# Patient Record
Sex: Male | Born: 1985 | Race: White | Hispanic: No | Marital: Single | Smoking: Current every day smoker
Health system: Southern US, Community
[De-identification: ages and names within clinical notes are randomized; demographics above are authoritative.]

## PROBLEM LIST (undated history)

## (undated) HISTORY — PX: OTHER SURGICAL HISTORY: SHX169

---

## 2009-06-07 ENCOUNTER — Emergency Department: Payer: Self-pay | Admitting: Emergency Medicine

## 2010-07-11 ENCOUNTER — Emergency Department: Payer: Self-pay | Admitting: Emergency Medicine

## 2010-07-17 ENCOUNTER — Emergency Department: Payer: Self-pay | Admitting: Emergency Medicine

## 2010-07-22 ENCOUNTER — Emergency Department: Payer: Self-pay | Admitting: Emergency Medicine

## 2013-08-22 ENCOUNTER — Ambulatory Visit: Payer: Self-pay

## 2013-08-22 LAB — COMPREHENSIVE METABOLIC PANEL
ALBUMIN: 4.1 g/dL (ref 3.4–5.0)
ALT: 22 U/L
Alkaline Phosphatase: 120 U/L — ABNORMAL HIGH
Anion Gap: 6 — ABNORMAL LOW (ref 7–16)
BUN: 10 mg/dL (ref 7–18)
Bilirubin,Total: 0.4 mg/dL (ref 0.2–1.0)
CALCIUM: 9.4 mg/dL (ref 8.5–10.1)
Chloride: 100 mmol/L (ref 98–107)
Co2: 30 mmol/L (ref 21–32)
Creatinine: 1.05 mg/dL (ref 0.60–1.30)
EGFR (African American): >60
GLUCOSE: 93 mg/dL (ref 65–99)
Osmolality: 271 (ref 275–301)
POTASSIUM: 4.3 mmol/L (ref 3.5–5.1)
SGOT(AST): 20 U/L (ref 15–37)
Sodium: 136 mmol/L (ref 136–145)
Total Protein: 7.8 g/dL (ref 6.4–8.2)

## 2013-08-22 LAB — CBC WITH DIFFERENTIAL/PLATELET
Basophil #: 0.1 10*3/uL (ref 0.0–0.1)
Basophil %: 1.8 %
EOS ABS: 1.3 10*3/uL — AB (ref 0.0–0.7)
Eosinophil %: 18.2 %
HCT: 49 % (ref 40.0–52.0)
HGB: 16.3 g/dL (ref 13.0–18.0)
LYMPHS PCT: 34.3 %
Lymphocyte #: 2.5 10*3/uL (ref 1.0–3.6)
MCH: 31.5 pg (ref 26.0–34.0)
MCHC: 33.2 g/dL (ref 32.0–36.0)
MCV: 95 fL (ref 80–100)
Monocyte #: 0.7 x10 3/mm (ref 0.2–1.0)
Monocyte %: 9.5 %
NEUTROS ABS: 2.7 10*3/uL (ref 1.4–6.5)
Neutrophil %: 36.2 %
PLATELETS: 306 10*3/uL (ref 150–440)
RBC: 5.16 10*6/uL (ref 4.40–5.90)
RDW: 12.6 % (ref 11.5–14.5)
WBC: 7.3 10*3/uL (ref 3.8–10.6)

## 2013-08-22 LAB — LIPASE, BLOOD: LIPASE: 83 U/L (ref 73–393)

## 2013-08-22 LAB — AMYLASE: AMYLASE: 54 U/L (ref 25–115)

## 2013-08-22 LAB — OCCULT BLOOD X 1 CARD TO LAB, STOOL: Occult Blood, Feces: POSITIVE

## 2014-07-16 ENCOUNTER — Emergency Department: Payer: Self-pay

## 2014-07-16 ENCOUNTER — Emergency Department
Admission: EM | Admit: 2014-07-16 | Discharge: 2014-07-16 | Disposition: A | Discharge status: Home / Self Care | Payer: Self-pay | Attending: Emergency Medicine | Admitting: Emergency Medicine

## 2014-07-16 ENCOUNTER — Encounter: Payer: Self-pay | Admitting: Urgent Care

## 2014-07-16 DIAGNOSIS — W25XXXA Contact with sharp glass, initial encounter: Secondary | ICD-10-CM | POA: Insufficient documentation

## 2014-07-16 DIAGNOSIS — Y998 Other external cause status: Secondary | ICD-10-CM | POA: Insufficient documentation

## 2014-07-16 DIAGNOSIS — M795 Residual foreign body in soft tissue: Secondary | ICD-10-CM

## 2014-07-16 DIAGNOSIS — Y9389 Activity, other specified: Secondary | ICD-10-CM | POA: Insufficient documentation

## 2014-07-16 DIAGNOSIS — S61411A Laceration without foreign body of right hand, initial encounter: Secondary | ICD-10-CM | POA: Insufficient documentation

## 2014-07-16 DIAGNOSIS — Y9289 Other specified places as the place of occurrence of the external cause: Secondary | ICD-10-CM | POA: Insufficient documentation

## 2014-07-16 DIAGNOSIS — S60511A Abrasion of right hand, initial encounter: Secondary | ICD-10-CM | POA: Insufficient documentation

## 2014-07-16 MED ORDER — BACITRACIN 500 UNIT/GM EX OINT
1.0000 "application " | TOPICAL_OINTMENT | Freq: Once | CUTANEOUS | Status: AC
  Administered 2014-07-16: 1 via TOPICAL
  Filled 2014-07-16: qty 0.9

## 2014-07-16 MED ORDER — OXYCODONE-ACETAMINOPHEN 5-325 MG PO TABS
1.0000 | ORAL_TABLET | Freq: Once | ORAL | Status: AC
  Administered 2014-07-16: 1 via ORAL
  Filled 2014-07-16: qty 1

## 2014-07-16 MED ORDER — BACITRACIN ZINC 500 UNIT/GM EX OINT
TOPICAL_OINTMENT | CUTANEOUS | Status: AC
  Administered 2014-07-16: 1 via TOPICAL
  Filled 2014-07-16: qty 0.9

## 2014-07-16 MED ORDER — LIDOCAINE-EPINEPHRINE (PF) 1 %-1:200000 IJ SOLN
INTRAMUSCULAR | Status: AC
  Administered 2014-07-16: 30 mL
  Filled 2014-07-16: qty 30

## 2014-07-16 MED ORDER — TRAMADOL HCL 50 MG PO TABS: 50.0000 mg | ORAL_TABLET | Freq: Four times a day (QID) | ORAL | Status: DC | PRN

## 2014-07-16 MED ORDER — LIDOCAINE-EPINEPHRINE (PF) 2 %-1:200000 IJ SOLN: 10.0000 mL | Freq: Once | INTRAMUSCULAR | Status: DC

## 2014-07-16 NOTE — ED Notes (Signed)
In to see pt - lac no longer bleeding. Family concerned about making court at 0830. Dr. Zenda AlpersWebster aware.

## 2014-07-16 NOTE — ED Notes (Signed)
Patient presents with a laceration to the RIGHT hand over the knuckle of his second digit. Active bleeding.
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
## 2014-07-16 NOTE — Discharge Instructions (Signed)
Abrasion °An abrasion is a cut or scrape of the skin. Abrasions do not extend through all layers of the skin and most heal within 10 days. It is important to care for your abrasion properly to prevent infection. °CAUSES  °Most abrasions are caused by falling on, or gliding across, the ground or other surface. When your skin rubs on something, the outer and inner layer of skin rubs off, causing an abrasion. °DIAGNOSIS  °Your caregiver will be able to diagnose an abrasion during a physical exam.  °TREATMENT  °Your treatment depends on how large and deep the abrasion is. Generally, your abrasion will be cleaned with water and a mild soap to remove any dirt or debris. An antibiotic ointment may be put over the abrasion to prevent an infection. A bandage (dressing) may be wrapped around the abrasion to keep it from getting dirty.  °You may need a tetanus shot if: °· You cannot remember when you had your last tetanus shot. °· You have never had a tetanus shot. °· The injury broke your skin. °If you get a tetanus shot, your arm may swell, get red, and feel warm to the touch. This is common and not a problem. If you need a tetanus shot and you choose not to have one, there is a rare chance of getting tetanus. Sickness from tetanus can be serious.  °HOME CARE INSTRUCTIONS  °· If a dressing was applied, change it at least once a day or as directed by your caregiver. If the bandage sticks, soak it off with warm water.   °· Wash the area with water and a mild soap to remove all the ointment 2 times a day. Rinse off the soap and pat the area dry with a clean towel.   °· Reapply any ointment as directed by your caregiver. This will help prevent infection and keep the bandage from sticking. Use gauze over the wound and under the dressing to help keep the bandage from sticking.   °· Change your dressing right away if it becomes wet or dirty.   °· Only take over-the-counter or prescription medicines for pain, discomfort, or fever as  directed by your caregiver.   °· Follow up with your caregiver within 24-48 hours for a wound check, or as directed. If you were not given a wound-check appointment, look closely at your abrasion for redness, swelling, or pus. These are signs of infection. °SEEK IMMEDIATE MEDICAL CARE IF:  °· You have increasing pain in the wound.   °· You have redness, swelling, or tenderness around the wound.   °· You have pus coming from the wound.   °· You have a fever or persistent symptoms for more than 2-3 days. °· You have a fever and your symptoms suddenly get worse. °· You have a bad smell coming from the wound or dressing.   °MAKE SURE YOU:  °· Understand these instructions. °· Will watch your condition. °· Will get help right away if you are not doing well or get worse. °Document Released: 09/30/2004 Document Revised: 12/08/2011 Document Reviewed: 11/24/2010 °Exit[employer]® Patient Information ©2015 [employer]. This information is not intended to replace advice given to you by your health care provider. Make sure you discuss any questions you have with your health care provider. ° °Laceration Care, Adult °A laceration is a cut or lesion that goes through all layers of the skin and into the tissue just beneath the skin. °TREATMENT  °Some lacerations may not require closure. Some lacerations may not be able to be closed due to   an increased risk of infection. It is important to see your caregiver as soon as possible after an injury to minimize the risk of infection and maximize the opportunity for successful closure. °If closure is appropriate, pain medicines may be given, if needed. The wound will be cleaned to help prevent infection. Your caregiver will use stitches (sutures), staples, wound glue (adhesive), or skin adhesive strips to repair the laceration. These tools bring the skin edges together to allow for faster healing and a better cosmetic outcome. However, all wounds will heal with a scar. Once the wound has  healed, scarring can be minimized by covering the wound with sunscreen during the day for 1 full year. °HOME CARE INSTRUCTIONS  °For sutures or staples: °· Keep the wound clean and dry. °· If you were given a bandage (dressing), you should change it at least once a day. Also, change the dressing if it becomes wet or dirty, or as directed by your caregiver. °· Wash the wound with soap and water 2 times a day. Rinse the wound off with water to remove all soap. Pat the wound dry with a clean towel. °· After cleaning, apply a thin layer of the antibiotic ointment as recommended by your caregiver. This will help prevent infection and keep the dressing from sticking. °· You may shower as usual after the first 24 hours. Do not soak the wound in water until the sutures are removed. °· Only take over-the-counter or prescription medicines for pain, discomfort, or fever as directed by your caregiver. °· Get your sutures or staples removed as directed by your caregiver. °For skin adhesive strips: °· Keep the wound clean and dry. °· Do not get the skin adhesive strips wet. You may bathe carefully, using caution to keep the wound dry. °· If the wound gets wet, pat it dry with a clean towel. °· Skin adhesive strips will fall off on their own. You may trim the strips as the wound heals. Do not remove skin adhesive strips that are still stuck to the wound. They will fall off in time. °For wound adhesive: °· You may briefly wet your wound in the shower or bath. Do not soak or scrub the wound. Do not swim. Avoid periods of heavy perspiration until the skin adhesive has fallen off on its own. After showering or bathing, gently pat the wound dry with a clean towel. °· Do not apply liquid medicine, cream medicine, or ointment medicine to your wound while the skin adhesive is in place. This may loosen the film before your wound is healed. °· If a dressing is placed over the wound, be careful not to apply tape directly over the skin  adhesive. This may cause the adhesive to be pulled off before the wound is healed. °· Avoid prolonged exposure to sunlight or tanning lamps while the skin adhesive is in place. Exposure to ultraviolet light in the first year will darken the scar. °· The skin adhesive will usually remain in place for 5 to 10 days, then naturally fall off the skin. Do not pick at the adhesive film. °You may need a tetanus shot if: °· You cannot remember when you had your last tetanus shot. °· You have never had a tetanus shot. °If you get a tetanus shot, your arm may swell, get red, and feel warm to the touch. This is common and not a problem. If you need a tetanus shot and you choose not to have one, there is a rare chance   of getting tetanus. Sickness from tetanus can be serious. °SEEK MEDICAL CARE IF:  °· You have redness, swelling, or increasing pain in the wound. °· You see a red line that goes away from the wound. °· You have yellowish-white fluid (pus) coming from the wound. °· You have a fever. °· You notice a bad smell coming from the wound or dressing. °· Your wound breaks open before or after sutures have been removed. °· You notice something coming out of the wound such as wood or glass. °· Your wound is on your hand or foot and you cannot move a finger or toe. °SEEK IMMEDIATE MEDICAL CARE IF:  °· Your pain is not controlled with prescribed medicine. °· You have severe swelling around the wound causing pain and numbness or a change in color in your arm, hand, leg, or foot. °· Your wound splits open and starts bleeding. °· You have worsening numbness, weakness, or loss of function of any joint around or beyond the wound. °· You develop painful lumps near the wound or on the skin anywhere on your body. °MAKE SURE YOU:  °· Understand these instructions. °· Will watch your condition. °· Will get help right away if you are not doing well or get worse. °Document Released: 12/21/2004 Document Revised: 03/15/2011 Document Reviewed:  06/16/2010 °ExitCare® Patient Information ©2015 [employer]. This information is not intended to replace advice given to you by your health care provider. Make sure you discuss any questions you have with your health care provider. ° °

## 2015-05-29 ENCOUNTER — Encounter: Payer: Self-pay | Admitting: Emergency Medicine

## 2015-05-29 ENCOUNTER — Ambulatory Visit
Admission: EM | Admit: 2015-05-29 | Discharge: 2015-05-29 | Disposition: A | Discharge status: Home / Self Care | Payer: Self-pay | Attending: Emergency Medicine | Admitting: Emergency Medicine

## 2015-05-29 DIAGNOSIS — K611 Rectal abscess: Secondary | ICD-10-CM

## 2015-05-29 MED ORDER — IBUPROFEN 800 MG PO TABS: 800.0000 mg | ORAL_TABLET | Freq: Three times a day (TID) | ORAL | Status: AC

## 2015-05-29 MED ORDER — SULFAMETHOXAZOLE-TRIMETHOPRIM 800-160 MG PO TABS: 2.0000 | ORAL_TABLET | Freq: Two times a day (BID) | ORAL | Status: AC

## 2015-05-29 MED ORDER — METRONIDAZOLE 500 MG PO TABS: 500.0000 mg | ORAL_TABLET | Freq: Three times a day (TID) | ORAL | Status: AC

## 2015-05-29 NOTE — ED Notes (Signed)
Pt reports rectal pain that started about 5 days ago denies bleeding or constipation. Pt reports worsened pain with sitting

## 2015-05-29 NOTE — Discharge Instructions (Signed)
Sitz baths as we discussed, make sure he finished all the medications and take them as directed. Ibuprofen for pain. Return here within 48 hours if you're not getting significantly better. Go to the ER if he gets significantly worse. Follow up with one of the primary care providers listed for routine medical care.

## 2015-05-29 NOTE — ED Provider Notes (Signed)
HPI  SUBJECTIVE:  Erik Ruiz is a 30 y.o. male who presents with 5 days of constant rectal pain that he describes as sore, sharp. It is worse with wiping himself, no alleviating factors. He has tried hemorrhoid cream, wet wipes, Epson salt soaks. He reports mild pain with having a bowel movement, but states that the pain is really when he wipes. He had a normal bowel movement this morning. He denies nausea, vomiting, fevers, abdominal pain, abdominal distention. No melena, hematochezia, anorexia. Denies any trauma to the area, foreign body insertion, anal sex. He has never had symptoms like this before. No antipyretic in the past 6-8 hours. Past medical history negative for diabetes, hypertension, MRSA, abscesses. PMD: None.   No past medical history on file.  Past Surgical History  Procedure Laterality Date  . Arm surgery      History reviewed. No pertinent family history.  Social History  Substance Use Topics  . Smoking status: Current Every Day Smoker -- 1.00 packs/day    Types: Cigarettes  . Smokeless tobacco: None  . Alcohol Use: No    No current facility-administered medications for this encounter.  Current outpatient prescriptions:  .  ibuprofen (ADVIL,MOTRIN) 800 MG tablet, Take 1 tablet (800 mg total) by mouth 3 (three) times daily., Disp: 30 tablet, Rfl: 0 .  metroNIDAZOLE (FLAGYL) 500 MG tablet, Take 1 tablet (500 mg total) by mouth 3 (three) times daily. X 7 days, Disp: 21 tablet, Rfl: 0 .  sulfamethoxazole-trimethoprim (BACTRIM DS,SEPTRA DS) 800-160 MG tablet, Take 2 tablets by mouth 2 (two) times daily., Disp: 28 tablet, Rfl: 0  No Known Allergies   ROS  As noted in HPI.   Physical Exam  BP 137/77 mmHg  Pulse 84  Temp(Src) 97.8 F (36.6 C) (Tympanic)  Resp 16  Ht 6\' 3"  (1.905 m)  Wt 165 lb (74.844 kg)  BMI 20.62 kg/m2  SpO2 98%  Constitutional: Well developed, well nourished, no acute distress Eyes:  EOMI, conjunctiva normal bilaterally HENT:  Normocephalic, atraumatic,mucus membranes moist Respiratory: Normal inspiratory effort Cardiovascular: Normal rate GI: nondistended skin: No rash, skin intact Rectal: Tender area of erythema measuring approximately 2.5 x 2 cm in the 3-4 clock position right perianal region. Questionable fluctuance. No expressible purulent drainage. Skin intact. Prostate nontender, normal size. No other hemorrhoids or masses. Musculoskeletal: no deformities Neurologic: Alert & oriented x 3, no focal neuro deficits Psychiatric: Speech and behavior appropriate   ED Course   Medications - No data to display  No orders of the defined types were placed in this encounter.    No results found for this or any previous visit (from the past 24 hour(s)). No results found.  ED Clinical Impression  Perirectal cellulitis   ED Assessment/Plan  Patient with a cellulitis, may be a small perirectal abscess. discussed with him that the treatment of abscesses is an I&D. However he declined having the procedure done today., so we'll try Bactrim 2 tabs twice a day for 7 days and Flagyl 500 mg 3 times a day for 7 days, ibuprofen, sitz baths. He is to return here for an I&D if not getting better in 48 hours, sooner if symptoms get worse. Discussed medical decision-making, plan for follow up, signs and symptoms that should prompt return here or to the emergency department. Patient agrees with plan. Will also give primary care provider names for routine medical care.   *This clinic note was created using Dragon dictation software. Therefore, there may be occasional mistakes  despite careful proofreading.  ?    Domenick Gong, MD 05/29/15 1224

## 2020-01-26 ENCOUNTER — Emergency Department (HOSPITAL_COMMUNITY)
Admission: EM | Admit: 2020-01-26 | Discharge: 2020-01-26 | Disposition: A | Discharge status: Home / Self Care | Payer: Self-pay | Attending: Emergency Medicine | Admitting: Emergency Medicine

## 2020-01-26 ENCOUNTER — Encounter (HOSPITAL_COMMUNITY): Payer: Self-pay | Admitting: Emergency Medicine

## 2020-01-26 ENCOUNTER — Other Ambulatory Visit: Payer: Self-pay

## 2020-01-26 DIAGNOSIS — F17210 Nicotine dependence, cigarettes, uncomplicated: Secondary | ICD-10-CM | POA: Insufficient documentation

## 2020-01-26 DIAGNOSIS — T23201A Burn of second degree of right hand, unspecified site, initial encounter: Secondary | ICD-10-CM | POA: Insufficient documentation

## 2020-01-26 DIAGNOSIS — X088XXA Exposure to other specified smoke, fire and flames, initial encounter: Secondary | ICD-10-CM | POA: Insufficient documentation

## 2020-01-26 MED ORDER — OXYCODONE-ACETAMINOPHEN 5-325 MG PO TABS
1.0000 | ORAL_TABLET | Freq: Once | ORAL | Status: AC
  Administered 2020-01-26: 1 via ORAL
  Filled 2020-01-26: qty 1

## 2020-01-26 MED ORDER — BACITRACIN-NEOMYCIN-POLYMYXIN 400-5-5000 EX OINT
TOPICAL_OINTMENT | Freq: Once | CUTANEOUS | Status: AC
  Administered 2020-01-26: 1 via TOPICAL
  Filled 2020-01-26: qty 1

## 2020-01-26 MED ORDER — HYDROCODONE-ACETAMINOPHEN 5-325 MG PO TABS: 1.0000 | ORAL_TABLET | Freq: Four times a day (QID) | ORAL | 0 refills | Status: AC | PRN

## 2020-01-26 NOTE — ED Notes (Signed)
No answer for room assignment x1 
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
## 2020-01-26 NOTE — Discharge Instructions (Addendum)
Call the [facility] [phone]. Schedule an appointment for early this coming week to ensure proper healing.  You can apply cool compress to your burns as needed for pain. Do not apply ice directly to your burns.  Do NOT pop the blisters. Keep your skin clean. Apply a triple antibiotic ointment to your burns daily. You can take ibuprofen every 6 hours as needed for pain. For severe pain, you can take hydrocodone. Return to the ED if you develop signs of infection.
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
## 2020-05-20 ENCOUNTER — Emergency Department: Payer: Self-pay

## 2020-05-20 ENCOUNTER — Other Ambulatory Visit: Payer: Self-pay

## 2020-05-20 ENCOUNTER — Emergency Department
Admission: EM | Admit: 2020-05-20 | Discharge: 2020-05-20 | Disposition: A | Discharge status: Home / Self Care | Payer: Self-pay | Attending: Emergency Medicine | Admitting: Emergency Medicine

## 2020-05-20 DIAGNOSIS — F17210 Nicotine dependence, cigarettes, uncomplicated: Secondary | ICD-10-CM | POA: Insufficient documentation

## 2020-05-20 DIAGNOSIS — Z20822 Contact with and (suspected) exposure to covid-19: Secondary | ICD-10-CM | POA: Insufficient documentation

## 2020-05-20 DIAGNOSIS — K2900 Acute gastritis without bleeding: Secondary | ICD-10-CM | POA: Insufficient documentation

## 2020-05-20 DIAGNOSIS — Z28310 Unvaccinated for covid-19: Secondary | ICD-10-CM | POA: Insufficient documentation

## 2020-05-20 LAB — COMPREHENSIVE METABOLIC PANEL
ALT: 16 U/L (ref 0–44)
AST: 16 U/L (ref 15–41)
Albumin: 4 g/dL (ref 3.5–5.0)
Alkaline Phosphatase: 77 U/L (ref 38–126)
Anion gap: 10 (ref 5–15)
BUN: 15 mg/dL (ref 6–20)
CO2: 22 mmol/L (ref 22–32)
Calcium: 9 mg/dL (ref 8.9–10.3)
Chloride: 101 mmol/L (ref 98–111)
Creatinine, Ser: 0.86 mg/dL (ref 0.61–1.24)
GFR, Estimated: >60 mL/min (ref >60)
Glucose, Bld: 121 mg/dL — ABNORMAL HIGH (ref 70–99)
Potassium: 3.5 mmol/L (ref 3.5–5.1)
Sodium: 133 mmol/L — ABNORMAL LOW (ref 135–145)
Total Bilirubin: 0.9 mg/dL (ref 0.3–1.2)
Total Protein: 7.3 g/dL (ref 6.5–8.1)

## 2020-05-20 LAB — LIPASE, BLOOD: Lipase: 26 U/L (ref 11–51)

## 2020-05-20 LAB — CBC
HCT: 38.6 % — ABNORMAL LOW (ref 39.0–52.0)
Hemoglobin: 13.7 g/dL (ref 13.0–17.0)
MCH: 30.2 pg (ref 26.0–34.0)
MCHC: 35.5 g/dL (ref 30.0–36.0)
MCV: 85.2 fL (ref 80.0–100.0)
Platelets: 269 10*3/uL (ref 150–400)
RBC: 4.53 MIL/uL (ref 4.22–5.81)
RDW: 11.9 % (ref 11.5–15.5)
WBC: 10.5 10*3/uL (ref 4.0–10.5)
nRBC: 0 % (ref 0.0–0.2)

## 2020-05-20 LAB — RESP PANEL BY RT-PCR (FLU A&B, COVID) ARPGX2
Influenza A by PCR: NEGATIVE
Influenza B by PCR: NEGATIVE
SARS Coronavirus 2 by RT PCR: NEGATIVE

## 2020-05-20 LAB — LACTIC ACID, PLASMA: Lactic Acid, Venous: 1.4 mmol/L (ref 0.5–1.9)

## 2020-05-20 MED ORDER — SODIUM CHLORIDE 0.9 % IV BOLUS
1000.0000 mL | Freq: Once | INTRAVENOUS | Status: AC
  Administered 2020-05-20: 1000 mL via INTRAVENOUS

## 2020-05-20 MED ORDER — FENTANYL CITRATE (PF) 100 MCG/2ML IJ SOLN
50.0000 ug | Freq: Once | INTRAMUSCULAR | Status: AC
Start: 2020-05-20 — End: 2020-05-20
  Administered 2020-05-20: 50 ug via INTRAVENOUS
  Filled 2020-05-20: qty 2

## 2020-05-20 MED ORDER — KETOROLAC TROMETHAMINE 30 MG/ML IJ SOLN
30.0000 mg | Freq: Once | INTRAMUSCULAR | Status: AC
  Administered 2020-05-20: 30 mg via INTRAVENOUS
  Filled 2020-05-20: qty 1

## 2020-05-20 MED ORDER — ONDANSETRON HCL 4 MG/2ML IJ SOLN
4.0000 mg | Freq: Once | INTRAMUSCULAR | Status: AC
  Administered 2020-05-20: 4 mg via INTRAVENOUS
  Filled 2020-05-20: qty 2

## 2020-05-20 MED ORDER — ONDANSETRON 4 MG PO TBDP: 4.0000 mg | ORAL_TABLET | Freq: Three times a day (TID) | ORAL | 0 refills | Status: AC | PRN

## 2020-05-20 MED ORDER — HALOPERIDOL LACTATE 5 MG/ML IJ SOLN
1.0000 mg | Freq: Once | INTRAMUSCULAR | Status: AC
  Administered 2020-05-20: 1 mg via INTRAVENOUS
  Filled 2020-05-20: qty 1

## 2020-05-20 NOTE — Discharge Instructions (Signed)
Your work-up was reassuring however we are concerned you might have a viral illness causing your nausea, vomiting and abdominal pain.  Take Zofran to help with nausea take Tylenol 1 g every 8 hours and ibuprofen 600 every 6 hours with food.  Turn to the ER for worsening pain, continued vomiting, fevers or any other concerns   Scattered areas of small bowel wall thickening and minimal  dilation with air-fluid levels as could be seen within nonspecific  enteritis. No evidence of obstruction.  2. Sigmoid diverticula without evidence of diverticulitis.

## 2020-05-20 NOTE — ED Provider Notes (Signed)
[facility] Emergency Department Provider Note  ____________________________________________   Event Date/Time   First MD Initiated Contact with Patient 05/20/20 1438     (approximate)  I have reviewed the triage vital signs and the nursing notes.   HISTORY  Chief Complaint Abdominal Pain and Back Pain   HPI Erik YURKOVICH is a 35 y.o. male presents to the ED with complaint of generalized body aches, chills, headache, nausea without vomiting and diarrhea that began this morning.  Patient is denies cough, change in taste or smell.  He denies any urinary symptoms.  He is unaware of any known COVID exposure.  He is nonvaccinated for COVID.  Patient continues to smoke approximately 1 pack cigarettes per day.  He rates his pain as 10/10.       History reviewed. No pertinent past medical history.  There are no problems to display for this patient.   Past Surgical History:  Procedure Laterality Date  . arm surgery      Prior to Admission medications   Medication Sig Start Date End Date Taking? Authorizing Provider  ondansetron (ZOFRAN ODT) 4 MG disintegrating tablet Take 1 tablet (4 mg total) by mouth every 8 (eight) hours as needed for nausea or vomiting. 05/20/20  Yes Concha Se, MD  HYDROcodone-acetaminophen (NORCO/VICODIN) 5-325 MG tablet Take 1 tablet by mouth every 6 (six) hours as needed for severe pain. 01/26/20   Robinson, [place] N, PA-C  ibuprofen (ADVIL,MOTRIN) 800 MG tablet Take 1 tablet (800 mg total) by mouth 3 (three) times daily. 05/29/15   Domenick Gong, MD  metroNIDAZOLE (FLAGYL) 500 MG tablet Take 1 tablet (500 mg total) by mouth 3 (three) times daily. X 7 days 05/29/15   Domenick Gong, MD  sulfamethoxazole-trimethoprim (BACTRIM DS,SEPTRA DS) 800-160 MG tablet Take 2 tablets by mouth 2 (two) times daily. 05/29/15   Domenick Gong, MD    Allergies Patient has no known allergies.  History reviewed. No pertinent family  history.  Social History Social History   Tobacco Use  . Smoking status: Current Every Day Smoker    Packs/day: 1.00    Types: Cigarettes  . Smokeless tobacco: Never Used  Substance Use Topics  . Alcohol use: No  . Drug use: Never    Review of Systems Constitutional: No fever/subjective chills Eyes: No visual changes. ENT: No sore throat. Cardiovascular: Denies chest pain. Respiratory: Denies shortness of breath.  Negative for cough. Gastrointestinal: Positive abdominal pain.  Positive nausea,  vomiting.  Positive diarrhea.  No constipation. Genitourinary: Negative for dysuria.  Negative for kidney stones. Musculoskeletal: Positive for generalized body aches. Skin: Negative for rash. Neurological: Positive for headache.  Negative for focal weakness or numbness. ____________________________________________   PHYSICAL EXAM:  VITAL SIGNS: ED Triage Vitals  Enc Vitals Group     BP 05/20/20 1338 129/87     Pulse Rate 05/20/20 1338 87     Resp 05/20/20 1338 18     Temp 05/20/20 1338 98.2 F (36.8 C)     Temp Source 05/20/20 1432 Oral     SpO2 05/20/20 1338 100 %     Weight --      Height --      Head Circumference --      Peak Flow --      Pain Score 05/20/20 1337 10     Pain Loc --      Pain Edu? --      Excl. in GC? --    Constitutional:  Alert and oriented. Well appearing and in no acute distress. Eyes: Conjunctivae are normal. PERRL. EOMI. Head: Atraumatic. Nose: No congestion/rhinnorhea. Neck: No stridor.   Cardiovascular: Normal rate, regular rhythm. Grossly normal heart sounds.  Good peripheral circulation. Respiratory: Normal respiratory effort.  No retractions. Lungs CTAB. Gastrointestinal: Soft and nontender. No distention.  No CVA tenderness.  Bowel sounds normoactive x4 quadrants. Musculoskeletal: Moves upper and lower extremities they have difficulty.  No edema noted lower extremities.   No tenderness is appreciated on palpation. Neurologic:  Normal  speech and language. No gross focal neurologic deficits are appreciated.  Skin:  Skin is warm, dry and intact. No rash noted. Psychiatric: Mood and affect are normal. Speech and behavior are normal.  ____________________________________________   LABS (all labs ordered are listed, but only abnormal results are displayed)  Labs Reviewed  COMPREHENSIVE METABOLIC PANEL - Abnormal; Notable for the following components:      Result Value   Sodium 133 (*)    Glucose, Bld 121 (*)    All other components within normal limits  CBC - Abnormal; Notable for the following components:   HCT 38.6 (*)    All other components within normal limits  RESP PANEL BY RT-PCR (FLU A&B, COVID) ARPGX2  LIPASE, BLOOD  LACTIC ACID, PLASMA  URINALYSIS, COMPLETE (UACMP) WITH MICROSCOPIC   ____________________________________________  EKG Reviewed without physician in major ED. Sinus rhythm with ventricular rate of 74. ____________________________________________  RADIOLOGY I, Tommi Rumps, personally viewed and evaluated these images (plain radiographs) as part of my medical decision making, as well as reviewing the written report by the radiologist.   Official radiology report(s): CT ABDOMEN PELVIS WO CONTRAST  Result Date: 05/20/2020 CLINICAL DATA:  35 year old male with right lower quadrant abdominal pain. EXAM: CT ABDOMEN AND PELVIS WITHOUT CONTRAST TECHNIQUE: Multidetector CT imaging of the abdomen and pelvis was performed following the standard protocol without IV contrast. COMPARISON:  None. FINDINGS: Lower chest: No acute abnormality. Hepatobiliary: No focal liver abnormality is seen. No gallstones, gallbladder wall thickening, or biliary dilatation. Pancreas: Unremarkable. No pancreatic ductal dilatation or surrounding inflammatory changes. Spleen: Normal in size without focal abnormality. Adrenals/Urinary Tract: Adrenal glands are unremarkable. Kidneys are normal, without renal calculi, focal  lesion, or hydronephrosis. Bladder is unremarkable. Stomach/Bowel: Stomach is within normal limits. There are few scattered ever is of minimally distended small bowel with air-fluid levels and a segment with focal bowel wall thickening up to 6 mm in the left upper quadrant. Appendix appears normal. The colon is relatively decompressed. Scattered diverticula in the sigmoid colon without surrounding inflammatory changes. Vascular/Lymphatic: No significant vascular findings are present, limited evaluation due to lack of contrast. No enlarged abdominal or pelvic lymph nodes. Reproductive: Prostate is unremarkable. Other: No abdominal wall hernia or abnormality. No abdominopelvic ascites. Musculoskeletal: No acute osseous abnormality. Postsurgical changes after right posterior acetabular ORIF without complicating features. IMPRESSION: 1. Scattered areas of small bowel wall thickening and minimal dilation with air-fluid levels as could be seen within nonspecific enteritis. No evidence of obstruction. 2. Sigmoid diverticula without evidence of diverticulitis. Electronically Signed   By: Marliss Coots MD   On: 05/20/2020 16:06    ____________________________________________   PROCEDURES  Procedure(s) performed (including Critical Care):  Procedures   ____________________________________________   INITIAL IMPRESSION / ASSESSMENT AND PLAN / ED COURSE  As part of my medical decision making, I reviewed the following data within the electronic MEDICAL RECORD NUMBER Notes from prior ED visits and [place] Controlled Substance Database  35 year old  male presents to the ED with complaint of back pain, abdominal pain, generalized aching, chills, headache, nausea and diarrhea.  Patient states that all symptoms began this morning.  Patient denies any known COVID exposure.  Patient admits to smoking cigarettes but denies any alcohol intake for previous GI problems.  Respiratory panel was negative for COVID and influenza.   Lipase, CMP, CBC and lactic acid were reassuring.  CT abdomen pelvis without contrast due to nationwide shortage of contrast was performed.  Enteritis was noted on CT scan by radiologist and diverticula  without diverticulitis.   Patient was given Zofran, normal saline, Toradol 30 mg IM.  He continues to have some nausea and was given Haldol 1 mg IV.  Patient improved and was discharged with instructions for gastritis.  Discharge charge medications included Zofran 4 mg every 8 hours as needed for nausea or vomiting.  ____________________________________________   FINAL CLINICAL IMPRESSION(S) / ED DIAGNOSES  Final diagnoses:  Acute gastritis without hemorrhage, unspecified gastritis type     ED Discharge Orders         Ordered    ondansetron (ZOFRAN ODT) 4 MG disintegrating tablet  Every 8 hours PRN        05/20/20 1756          *Please note:  BERLYN MALINA was evaluated in Emergency Department on 05/20/2020 for the symptoms described in the history of present illness. He was evaluated in the context of the global COVID-19 pandemic, which necessitated consideration that the patient might be at risk for infection with the SARS-CoV-2 virus that causes COVID-19. Institutional protocols and algorithms that pertain to the evaluation of patients at risk for COVID-19 are in a state of rapid change based on information released by regulatory bodies including the CDC and federal and state organizations. These policies and algorithms were followed during the patient's care in the ED.  Some ED evaluations and interventions may be delayed as a result of limited staffing during and the pandemic.*   Note:  This document was prepared using Dragon voice recognition software and may include unintentional dictation errors.    Tommi Rumps, PA-C 05/20/20 1852    Concha Se, MD 05/21/20 Burna Mortimer

## 2020-05-20 NOTE — ED Notes (Signed)
Pt left before receiving discharge paperwork. 

## 2020-05-20 NOTE — ED Triage Notes (Signed)
Pt comes with c/o back pain and abdominal pain. Pt states this started last night. Pt states N/D.

## 2020-06-04 DEATH — deceased

## 2022-10-03 IMAGING — CT CT ABD-PELV W/O CM
2 of 4 series · 16 of 46 positions shown, 18 images · non-contrast
Comparison: None.

CLINICAL DATA: 34-year-old male with right lower quadrant abdominal
pain.

EXAM:
CT ABDOMEN AND PELVIS WITHOUT CONTRAST
TECHNIQUE: Multidetector CT imaging of the abdomen and pelvis was performed
following the standard protocol without IV contrast.

[Series 2: axial st · axial · 0.69mm/px · z∈[-767,-332]mm · 13 of 95 slices shown, 15 images]
[im 4/95  soft-tissue]
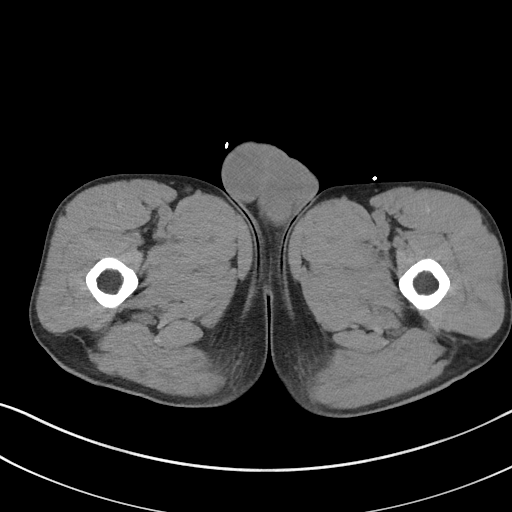
[im 4/95  bone]
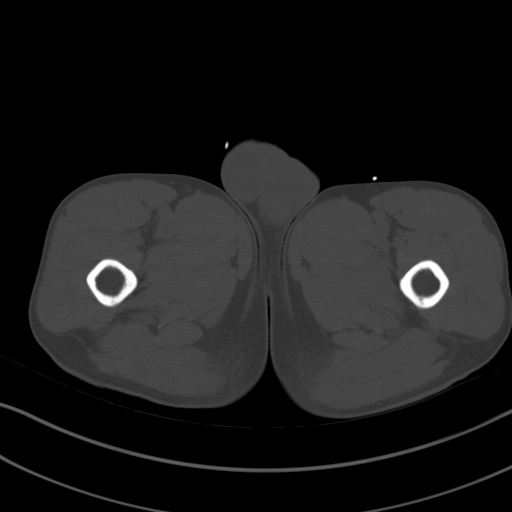
[im 11/95  soft-tissue]
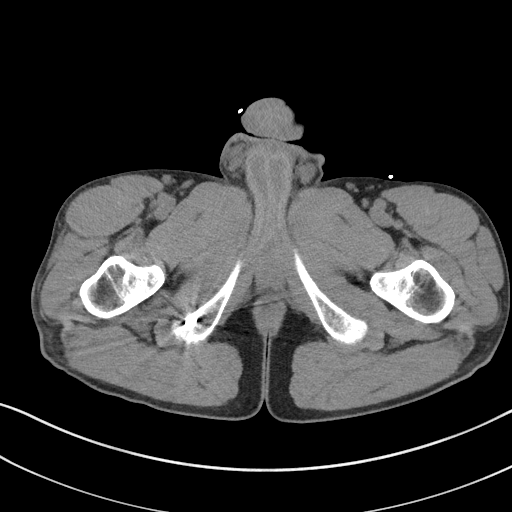
[im 19/95  soft-tissue]
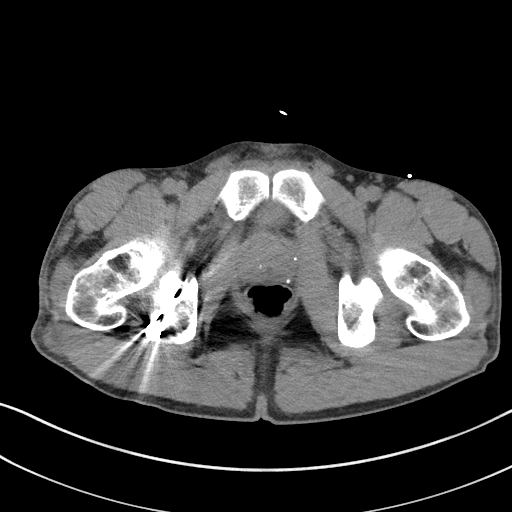
[im 26/95  soft-tissue]
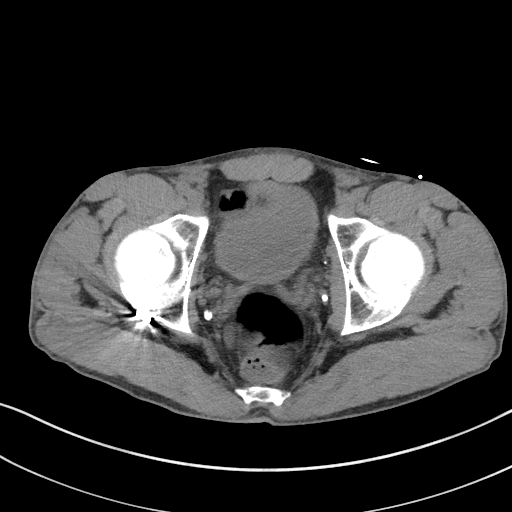
[im 33/95  soft-tissue]
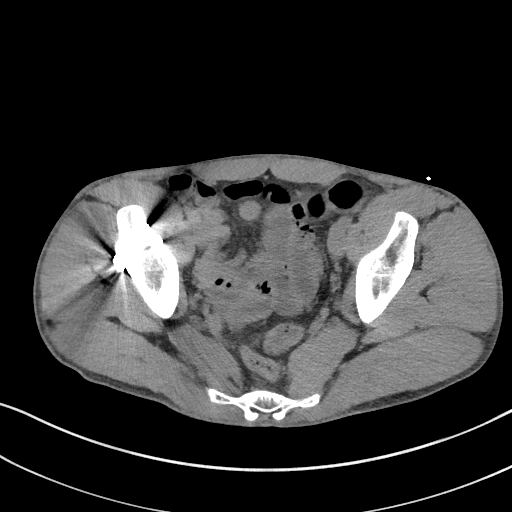
[im 40/95  soft-tissue]
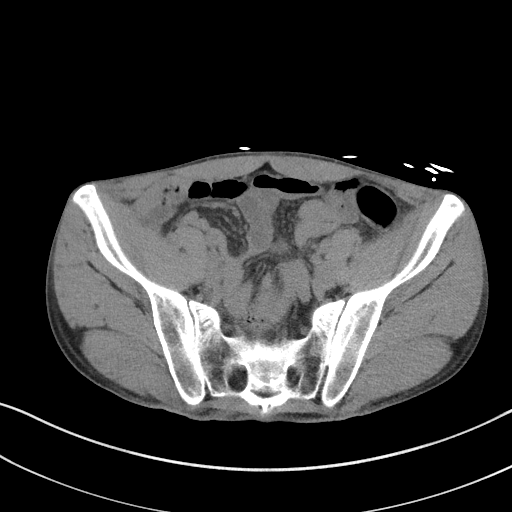
[im 48/95  soft-tissue]
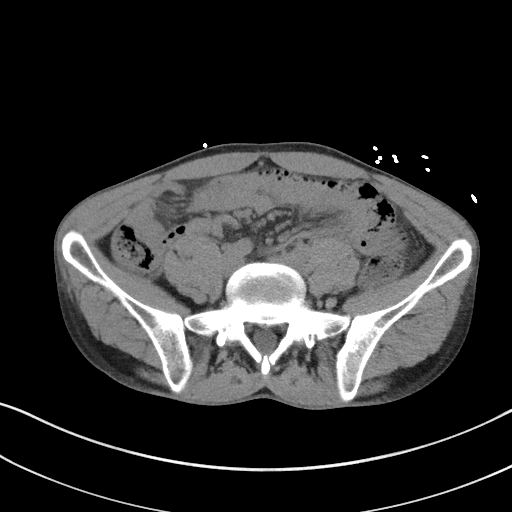
[im 55/95  soft-tissue]
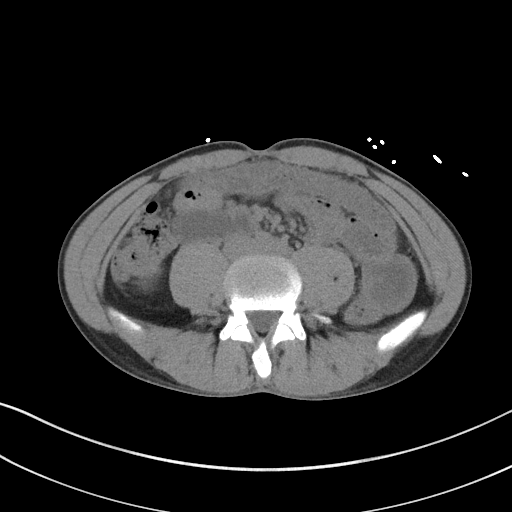
[im 62/95  soft-tissue]
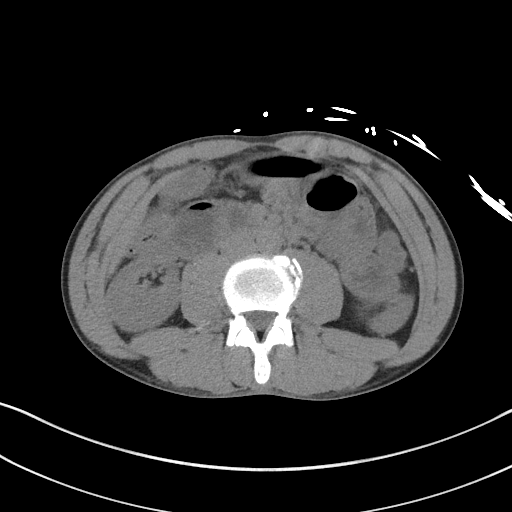
[im 62/95  bone]
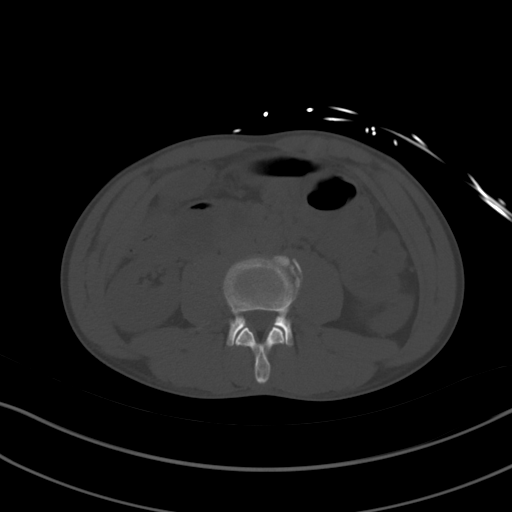
[im 69/95  soft-tissue]
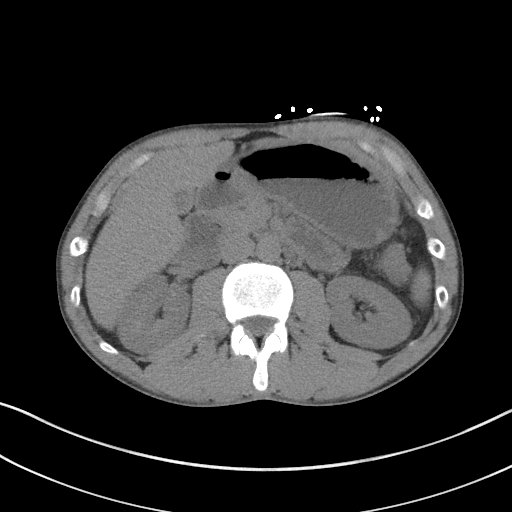
[im 76/95  soft-tissue]
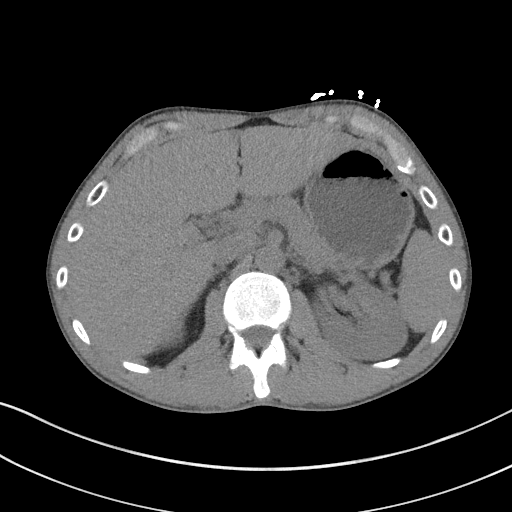
[im 84/95  soft-tissue]
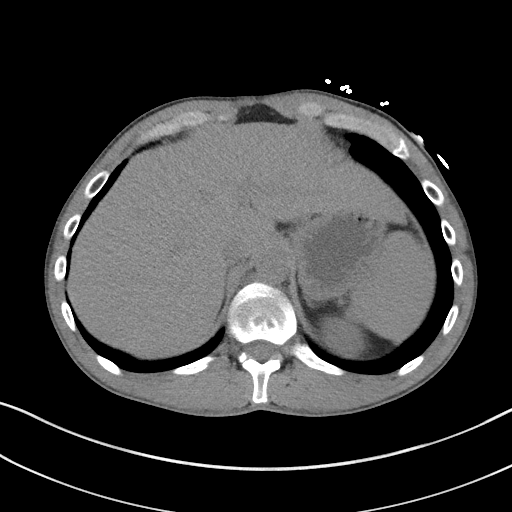
[im 91/95  soft-tissue]
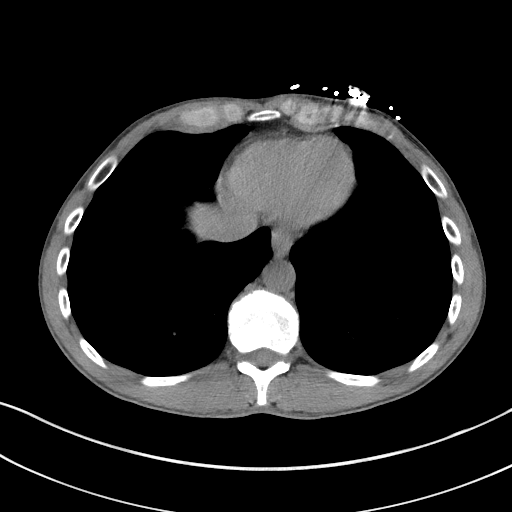

[Series 5: coronal st · coronal · 0.76mm/px · 3 of 74 slices shown]
[im 25/74  soft-tissue]
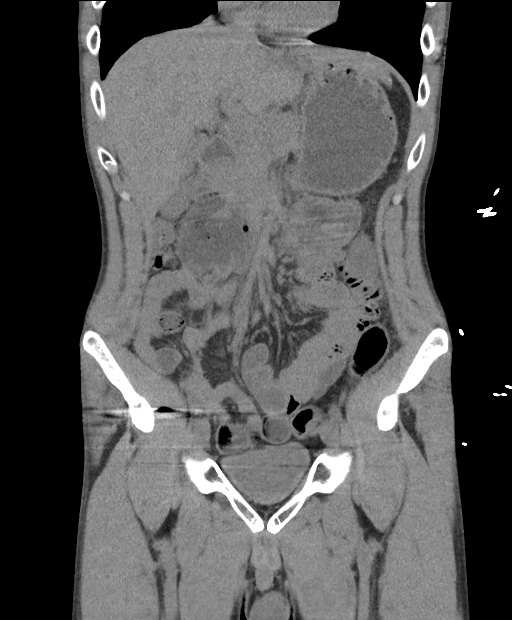
[im 33/74  soft-tissue]
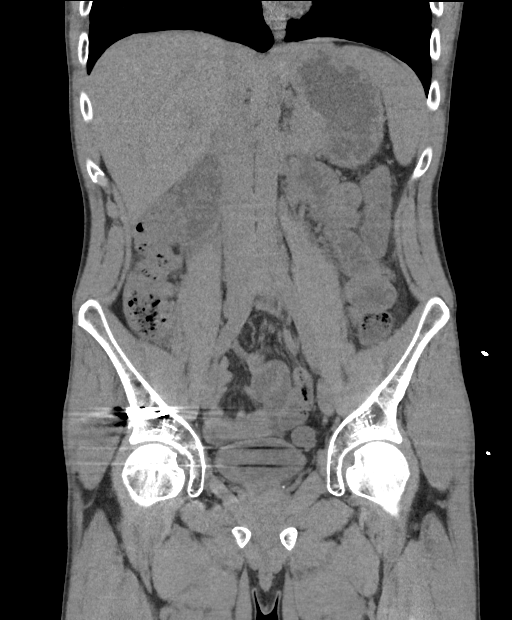
[im 41/74  soft-tissue]
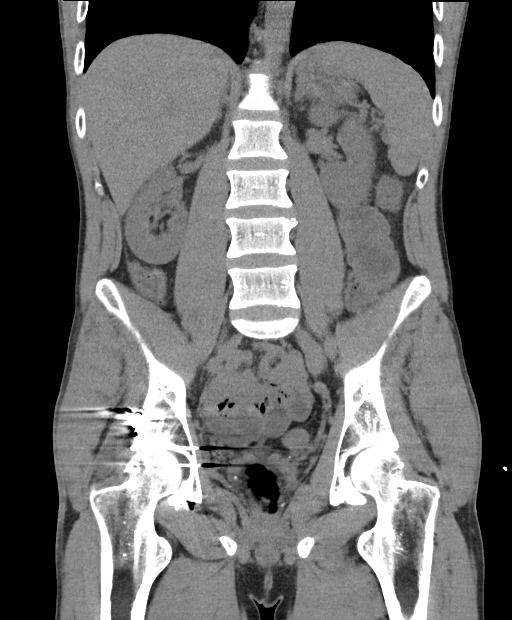

[16 of 46 positions shown; findings below may reference images not displayed]

FINDINGS: Lower chest: No acute abnormality.

Hepatobiliary: No focal liver abnormality is seen. No gallstones,
gallbladder wall thickening, or biliary dilatation.

Pancreas: Unremarkable. No pancreatic ductal dilatation or
surrounding inflammatory changes.

Spleen: Normal in size without focal abnormality.

Adrenals/Urinary Tract: Adrenal glands are unremarkable. Kidneys are
normal, without renal calculi, focal lesion, or hydronephrosis.
Bladder is unremarkable.

Stomach/Bowel: Stomach is within normal limits. There are few
scattered ever is of minimally distended small bowel with air-fluid
levels and a segment with focal bowel wall thickening up to 6 mm in
the left upper quadrant. Appendix appears normal. The colon is
relatively decompressed. Scattered diverticula in the sigmoid colon
without surrounding inflammatory changes.

Vascular/Lymphatic: No significant vascular findings are present,
limited evaluation due to lack of contrast. No enlarged abdominal or
pelvic lymph nodes.

Reproductive: Prostate is unremarkable.

Other: No abdominal wall hernia or abnormality. No abdominopelvic
ascites.

Musculoskeletal: No acute osseous abnormality. Postsurgical changes
after right posterior acetabular ORIF without complicating features.
IMPRESSION: 1. Scattered areas of small bowel wall thickening and minimal
dilation with air-fluid levels as could be seen within nonspecific
enteritis. No evidence of obstruction.
2. Sigmoid diverticula without evidence of diverticulitis.
